# Patient Record
Sex: Male | Born: 1995 | Race: White | Hispanic: No | Marital: Single | Smoking: Former smoker
Health system: Southern US, Community
[De-identification: ages and names within clinical notes are randomized; demographics above are authoritative.]

## PROBLEM LIST (undated history)

## (undated) DIAGNOSIS — F419 Anxiety disorder, unspecified: Secondary | ICD-10-CM

## (undated) DIAGNOSIS — F909 Attention-deficit hyperactivity disorder, unspecified type: Secondary | ICD-10-CM

## (undated) DIAGNOSIS — F32A Depression, unspecified: Secondary | ICD-10-CM

## (undated) DIAGNOSIS — J45909 Unspecified asthma, uncomplicated: Secondary | ICD-10-CM

## (undated) DIAGNOSIS — R109 Unspecified abdominal pain: Secondary | ICD-10-CM

## (undated) DIAGNOSIS — T7840XA Allergy, unspecified, initial encounter: Secondary | ICD-10-CM

## (undated) DIAGNOSIS — F329 Major depressive disorder, single episode, unspecified: Secondary | ICD-10-CM

## (undated) DIAGNOSIS — N62 Hypertrophy of breast: Secondary | ICD-10-CM

## (undated) HISTORY — DX: Hypertrophy of breast: N62

## (undated) HISTORY — DX: Unspecified asthma, uncomplicated: J45.909

## (undated) HISTORY — DX: Depression, unspecified: F32.A

## (undated) HISTORY — DX: Allergy, unspecified, initial encounter: T78.40XA

## (undated) HISTORY — DX: Attention-deficit hyperactivity disorder, unspecified type: F90.9

## (undated) HISTORY — DX: Unspecified abdominal pain: R10.9

## (undated) HISTORY — DX: Anxiety disorder, unspecified: F41.9

## (undated) HISTORY — DX: Major depressive disorder, single episode, unspecified: F32.9

---
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
## 2016-07-02 ENCOUNTER — Encounter: Payer: Self-pay | Admitting: Gastroenterology

## 2016-08-09 ENCOUNTER — Ambulatory Visit: Payer: Self-pay | Admitting: Internal Medicine

## 2016-08-10 ENCOUNTER — Ambulatory Visit: Payer: Self-pay | Admitting: Gastroenterology

## 2016-10-21 ENCOUNTER — Ambulatory Visit: Payer: Self-pay | Admitting: Gastroenterology

## 2019-04-05 ENCOUNTER — Encounter: Payer: Self-pay | Admitting: Gastroenterology

## 2019-05-10 ENCOUNTER — Ambulatory Visit: Payer: Self-pay | Admitting: Gastroenterology

## 2019-05-14 ENCOUNTER — Other Ambulatory Visit: Payer: Self-pay

## 2019-05-14 ENCOUNTER — Ambulatory Visit (INDEPENDENT_AMBULATORY_CARE_PROVIDER_SITE_OTHER): Payer: PRIVATE HEALTH INSURANCE | Admitting: Gastroenterology

## 2019-05-14 ENCOUNTER — Encounter: Payer: Self-pay | Admitting: Gastroenterology

## 2019-05-14 ENCOUNTER — Other Ambulatory Visit (INDEPENDENT_AMBULATORY_CARE_PROVIDER_SITE_OTHER): Payer: PRIVATE HEALTH INSURANCE

## 2019-05-14 VITALS — BP 102/68 | HR 67 | Temp 97.6°F | Ht 71.0 in | Wt 210.8 lb

## 2019-05-14 DIAGNOSIS — R194 Change in bowel habit: Secondary | ICD-10-CM | POA: Diagnosis not present

## 2019-05-14 LAB — SEDIMENTATION RATE: Sed Rate: 7 mm/hr (ref 0–15)

## 2019-05-14 NOTE — Progress Notes (Signed)
HPI: This is a very pleasant 23 year old man who was referred by Dr. Bevelyn Buckles  Chief complaint is change in bowel habits  About a year ago he began to be bothered by a change in bowel habits.  Prior to this he would move his bowels once or twice a day.  About a year ago however this increased to 2-3 times a day usually he is woken up with the urge to move his bowels about every morning.  It can be loose it can be a bit constipated at times.  He has noticed a very minor blood in his stool at times.  He does tend to have some cramping in the early morning before he finally moves his bowels.  That cramping pain is almost always relieved when he moves his bowels.  He has not been on antibiotics anytime recently.  He drinks an energy drink with caffeine every morning and then usually a caffeinated soda in the p.m. as well.  He does not drink much alcohol.  He lost almost 100 pounds 2 years ago.  This was when he started working he was skipping a lot of meals.  Since then he has probably put back on 25 pounds.  His maternal grandmother and his paternal uncle both had Crohn's disease.  No colon cancer in his family  Old Data Reviewed: Lab tests August [id number] complete metabolic profile was normal, CBC was normal   Review of systems: Pertinent positive and negative review of systems were noted in the above HPI section. All other review negative.   Past Medical History:  Diagnosis Date  . Abdominal pain   . ADHD (attention deficit hyperactivity disorder)   . Allergy   . Depression   . Gynecomastia    Not diagnosed- sx consulted    Past Surgical History:  Procedure Laterality Date  . TONSILLECTOMY AND ADENOIDECTOMY  1999    Current Outpatient Medications  Medication Sig Dispense Refill  . diphenhydrAMINE (BENADRYL) 25 mg capsule 1-2 tabs three times a day as needed for itching    . doxycycline (DORYX) 100 MG EC tablet Take 100 mg by mouth 2 (two) times daily.    Marland Kitchen ibuprofen  (ADVIL,MOTRIN) 200 MG tablet Take 200 mg by mouth 3 (three) times daily as needed.     No current facility-administered medications for this visit.    Allergies as of 05/14/2019  . (No Known Allergies)    Family History  Problem Relation Age of Onset  . Crohn's disease Maternal Grandmother   . Crohn's disease Paternal Uncle     Social History   Socioeconomic History  . Marital status: Single    Spouse name: Not on file  . Number of children: Not on file  . Years of education: Not on file  . Highest education level: Not on file  Occupational History  . Not on file  Tobacco Use  . Smoking status: Former Smoker    Packs/day: 0.25    Years: 2.00    Pack years: 0.50    Types: Cigarettes    Start date: 2014    Quit date: 2016    Years since quitting: 4.9  . Smokeless tobacco: Never Used  Substance and Sexual Activity  . Alcohol use: Yes    Comment: social  . Drug use: Yes    Types: Marijuana    Comment: occ  . Sexual activity: Never  Other Topics Concern  . Not on file  Social History Narrative  . Not  on file   Social Determinants of Health   Financial Resource Strain:   . Difficulty of Paying Living Expenses: Not on file  Food Insecurity:   . Worried About Charity fundraiser in the Last Year: Not on file  . Ran Out of Food in the Last Year: Not on file  Transportation Needs:   . Lack of Transportation (Medical): Not on file  . Lack of Transportation (Non-Medical): Not on file  Physical Activity:   . Days of Exercise per Week: Not on file  . Minutes of Exercise per Session: Not on file  Stress:   . Feeling of Stress : Not on file  Social Connections:   . Frequency of Communication with Friends and Family: Not on file  . Frequency of Social Gatherings with Friends and Family: Not on file  . Attends Religious Services: Not on file  . Active Member of Clubs or Organizations: Not on file  . Attends Archivist Meetings: Not on file  . Marital  Status: Not on file  Intimate Partner Violence:   . Fear of Current or Ex-Partner: Not on file  . Emotionally Abused: Not on file  . Physically Abused: Not on file  . Sexually Abused: Not on file     Physical Exam: BP 102/68 (BP Location: Left Arm, Patient Position: Sitting, Cuff Size: Normal)   Pulse 67   Temp 97.6 F (36.4 C)   Ht 5\' 11"  (1.803 m)   Wt 210 lb 12.8 oz (95.6 kg)   BMI 29.40 kg/m  Constitutional: generally well-appearing Psychiatric: alert and oriented x3 Eyes: extraocular movements intact Mouth: oral pharynx moist, no lesions Neck: supple no lymphadenopathy Cardiovascular: heart regular rate and rhythm Lungs: clear to auscultation bilaterally Abdomen: soft, nontender, nondistended, no obvious ascites, no peritoneal signs, normal bowel sounds Extremities: no lower extremity edema bilaterally Skin: no lesions on visible extremities   Assessment and plan: 23 y.o. male with change in bowels  For about a year he has had 2-3 stools with some urgency and cramping in the morning usually.  Minor rectal bleeding.  Not sure if this is inflammatory bowel disease but it does run in his family with 2 second-degree relatives.  I recommended stool tests with GI pathogen panel and blood tests with sedimentation rate and celiac sprue serologies.  I recommended a trial of Imodium at bedtime every night.  Pending the results of the above he might need further testing, possibly a colonoscopy.   Please see the "Patient Instructions" section for addition details about the plan.   Owens Loffler, MD [facility] 05/14/2019, 8:35 AM  Cc: Bevelyn Buckles, MD

## 2019-05-14 NOTE — Patient Instructions (Signed)
If you are age 23 or older, your body mass index should be between 23-30. Your Body mass index is 29.4 kg/m. If this is out of the aforementioned range listed, please consider follow up with your Primary Care Provider.  If you are age 56 or younger, your body mass index should be between 19-25. Your Body mass index is 29.4 kg/m. If this is out of the aformentioned range listed, please consider follow up with your Primary Care Provider.   Your provider has requested that you go to the basement level for lab work before leaving today. Press "B" on the elevator. The lab is located at the first door on the left as you exit the elevator.   Please take 1 Imodium at bedtime.  Thank you for choosing me and [facility]

## 2019-05-15 LAB — TISSUE TRANSGLUTAMINASE, IGA: (tTG) Ab, IgA: 1 U/mL

## 2019-05-16 ENCOUNTER — Other Ambulatory Visit: Payer: Self-pay

## 2019-05-16 DIAGNOSIS — R194 Change in bowel habit: Secondary | ICD-10-CM

## 2019-05-17 ENCOUNTER — Other Ambulatory Visit (INDEPENDENT_AMBULATORY_CARE_PROVIDER_SITE_OTHER): Payer: PRIVATE HEALTH INSURANCE

## 2019-05-17 DIAGNOSIS — R194 Change in bowel habit: Secondary | ICD-10-CM

## 2019-05-17 LAB — IGA: IgA: 188 mg/dL (ref 68–378)

## 2019-05-21 LAB — GASTROINTESTINAL PATHOGEN PANEL PCR
C. difficile Tox A/B, PCR: NOT DETECTED
Campylobacter, PCR: NOT DETECTED
Cryptosporidium, PCR: NOT DETECTED
E coli (ETEC) LT/ST PCR: NOT DETECTED
E coli (STEC) stx1/stx2, PCR: NOT DETECTED
E coli 0157, PCR: NOT DETECTED
Giardia lamblia, PCR: NOT DETECTED
Norovirus, PCR: NOT DETECTED
Rotavirus A, PCR: NOT DETECTED
Salmonella, PCR: NOT DETECTED
Shigella, PCR: NOT DETECTED

## 2019-06-18 ENCOUNTER — Encounter: Payer: Self-pay | Admitting: Gastroenterology

## 2019-06-26 ENCOUNTER — Other Ambulatory Visit: Payer: Self-pay

## 2019-06-26 ENCOUNTER — Ambulatory Visit (AMBULATORY_SURGERY_CENTER): Payer: Self-pay | Admitting: *Deleted

## 2019-06-26 VITALS — Temp 97.8°F | Ht 71.0 in | Wt 208.0 lb

## 2019-06-26 DIAGNOSIS — Z01818 Encounter for other preprocedural examination: Secondary | ICD-10-CM

## 2019-06-26 DIAGNOSIS — R194 Change in bowel habit: Secondary | ICD-10-CM

## 2019-06-26 MED ORDER — SUPREP BOWEL PREP KIT 17.5-3.13-1.6 GM/177ML PO SOLN: ORAL | 0 refills | Status: DC

## 2019-06-26 NOTE — Progress Notes (Signed)
Pt is aware that care partner will wait in the car during procedure; if they feel like they will be too hot or cold to wait in the car; they may wait in the 4 th floor lobby. Patient is aware to bring only one care partner. We want them to wear a mask (we do not have any that we can provide them), practice social distancing, and we will check their temperatures when they get here.  I did remind the patient that their care partner needs to stay in the parking lot the entire time and have a cell phone available, we will call them when the pt is ready for discharge. Patient will wear mask into building.   No egg or soy allergy  No home oxygen use  No medications for weight loss taken  emmi information given  covid test 07-05-19 at 915

## 2019-07-05 ENCOUNTER — Other Ambulatory Visit: Payer: Self-pay | Admitting: Gastroenterology

## 2019-07-05 ENCOUNTER — Ambulatory Visit (INDEPENDENT_AMBULATORY_CARE_PROVIDER_SITE_OTHER): Payer: PRIVATE HEALTH INSURANCE

## 2019-07-05 DIAGNOSIS — Z1159 Encounter for screening for other viral diseases: Secondary | ICD-10-CM

## 2019-07-06 LAB — SARS CORONAVIRUS 2 (TAT 6-24 HRS): SARS Coronavirus 2: NEGATIVE

## 2019-07-09 ENCOUNTER — Other Ambulatory Visit: Payer: Self-pay

## 2019-07-09 ENCOUNTER — Encounter: Payer: Self-pay | Admitting: Gastroenterology

## 2019-07-09 ENCOUNTER — Ambulatory Visit (AMBULATORY_SURGERY_CENTER): Payer: PRIVATE HEALTH INSURANCE | Admitting: Gastroenterology

## 2019-07-09 VITALS — BP 97/70 | HR 50 | Temp 97.7°F | Resp 13 | Ht 71.0 in | Wt 208.0 lb

## 2019-07-09 DIAGNOSIS — R194 Change in bowel habit: Secondary | ICD-10-CM

## 2019-07-09 DIAGNOSIS — K5289 Other specified noninfective gastroenteritis and colitis: Secondary | ICD-10-CM | POA: Diagnosis not present

## 2019-07-09 DIAGNOSIS — D122 Benign neoplasm of ascending colon: Secondary | ICD-10-CM

## 2019-07-09 DIAGNOSIS — K5140 Inflammatory polyps of colon without complications: Secondary | ICD-10-CM

## 2019-07-09 DIAGNOSIS — K529 Noninfective gastroenteritis and colitis, unspecified: Secondary | ICD-10-CM

## 2019-07-09 MED ORDER — SODIUM CHLORIDE 0.9 % IV SOLN: 500.0000 mL | Freq: Once | INTRAVENOUS | Status: DC

## 2019-07-09 NOTE — Progress Notes (Signed)
Called to room to assist during endoscopic procedure.  Patient ID and intended procedure confirmed with present staff. Received instructions for my participation in the procedure from the performing physician.  

## 2019-07-09 NOTE — Op Note (Signed)
[place] Patient Name: Tydarius Minas Procedure Date: 07/09/2019 12:52 PM MRN: [id number] Endoscopist: Milus Banister , MD Age: 24 Referring MD:  Date of Birth: [birth year] Gender: Male Account #: [id number] Procedure:                Colonoscopy Indications:              CHange in bowels, urgency, FH of Crohn's disease Medicines:                Monitored Anesthesia Care Procedure:                Pre-Anesthesia Assessment:                           - Prior to the procedure, a History and Physical                            was performed, and patient medications and                            allergies were reviewed. The patient's tolerance of                            previous anesthesia was also reviewed. The risks                            and benefits of the procedure and the sedation                            options and risks were discussed with the patient.                            All questions were answered, and informed consent                            was obtained. Prior Anticoagulants: The patient has                            taken no previous anticoagulant or antiplatelet                            agents. ASA Grade Assessment: II - A patient with                            mild systemic disease. After reviewing the risks                            and benefits, the patient was deemed in                            satisfactory condition to undergo the procedure.                           After obtaining informed consent, the colonoscope  was passed under direct vision. Throughout the                            procedure, the patient's blood pressure, pulse, and                            oxygen saturations were monitored continuously. The                            Colonoscope was introduced through the anus and                            advanced to the the terminal ileum. The colonoscopy                            was  performed without difficulty. The patient                            tolerated the procedure well. The quality of the                            bowel preparation was good. The terminal ileum,                            ileocecal valve, appendiceal orifice, and rectum                            were photographed. Scope In: 1:18:18 PM Scope Out: 1:30:45 PM Scope Withdrawal Time: 0 hours 10 minutes 6 seconds  Total Procedure Duration: 0 hours 12 minutes 27 seconds  Findings:                 Small soft fleshy nodules throughout terminal                            ileum. Likely lymphoid hyperplasia but biopsies                            taken given FH of Crohn's disease.                           A 5 mm polyp was found in the ascending colon. The                            polyp was sessile. The polyp was removed with a                            cold snare. Resection and retrieval were complete.                           Normal colon mucosa otherwise, biopsied randomly.                           The exam was otherwise without abnormality on  direct and retroflexion views. Complications:            No immediate complications. Estimated blood loss:                            None. Estimated Blood Loss:     Estimated blood loss: none. Impression:               - Small soft fleshy nodules throughout terminal                            ileum. Likely lymphoid hyperplasia but biopsies                            taken given FH of Crohn's disease.                           - One 5 mm polyp in the ascending colon, removed                            with a cold snare. Resected and retrieved.                           - Normal colon mucosa otherwise, biopsied randomly                            to check for microscopic colitis.                           - The examination was otherwise normal on direct                            and retroflexion views. Recommendation:            - Patient has a contact number available for                            emergencies. The signs and symptoms of potential                            delayed complications were discussed with the                            patient. Return to normal activities tomorrow.                            Written discharge instructions were provided to the                            patient.                           - Resume previous diet.                           - Continue present medications.                           -  Await pathology results. Milus Banister, MD 07/09/2019 1:35:53 PM This report has been signed electronically.

## 2019-07-09 NOTE — Progress Notes (Signed)
Report to PACU, RN, vss, BBS= Clear.  

## 2019-07-09 NOTE — Progress Notes (Signed)
Pt's states no medical or surgical changes since previsit or office visit.  Temp JB VS DT  

## 2019-07-09 NOTE — Patient Instructions (Signed)
1 polyp removed today.  Dr Ardis Hughs will mail you a letter about these results.   YOU HAD AN ENDOSCOPIC PROCEDURE TODAY AT [facility]:   Refer to the procedure report that was given to you for any specific questions about what was found during the examination.  If the procedure report does not answer your questions, please call your gastroenterologist to clarify.  If you requested that your care partner not be given the details of your procedure findings, then the procedure report has been included in a sealed envelope for you to review at your convenience later.  YOU SHOULD EXPECT: Some feelings of bloating in the abdomen. Passage of more gas than usual.  Walking can help get rid of the air that was put into your GI tract during the procedure and reduce the bloating. If you had a lower endoscopy (such as a colonoscopy or flexible sigmoidoscopy) you may notice spotting of blood in your stool or on the toilet paper. If you underwent a bowel prep for your procedure, you may not have a normal bowel movement for a few days.  Please Note:  You might notice some irritation and congestion in your nose or some drainage.  This is from the oxygen used during your procedure.  There is no need for concern and it should clear up in a day or so.  SYMPTOMS TO REPORT IMMEDIATELY:   Following lower endoscopy (colonoscopy or flexible sigmoidoscopy):  Excessive amounts of blood in the stool  Significant tenderness or worsening of abdominal pains  Swelling of the abdomen that is new, acute  Fever of 100F or higher   For urgent or emergent issues, a gastroenterologist can be reached at any hour by calling [phone].   DIET:  We do recommend a small meal at first, but then you may proceed to your regular diet.  Drink plenty of fluids but you should avoid alcoholic beverages for 24 hours.  ACTIVITY:  You should plan to take it easy for the rest of today and you should NOT DRIVE or use heavy  machinery until tomorrow (because of the sedation medicines used during the test).    FOLLOW UP: Our staff will call the number listed on your records 48-72 hours following your procedure to check on you and address any questions or concerns that you may have regarding the information given to you following your procedure. If we do not reach you, we will leave a message.  We will attempt to reach you two times.  During this call, we will ask if you have developed any symptoms of COVID 19. If you develop any symptoms (ie: fever, flu-like symptoms, shortness of breath, cough etc.) before then, please call [phone].  If you test positive for Covid 19 in the 2 weeks post procedure, please call and report this information to [place].    If any biopsies were taken you will be contacted by phone or by letter within the next 1-3 weeks.  Please call us at [phone] if you have not heard about the biopsies in 3 weeks.    SIGNATURES/CONFIDENTIALITY: You and/or your care partner have signed paperwork which will be entered into your electronic medical record.  These signatures attest to the fact that that the information above on your After Visit Summary has been reviewed and is understood.  Full responsibility of the confidentiality of this discharge information lies with you and/or your care-partner.

## 2019-07-11 ENCOUNTER — Telehealth: Payer: Self-pay

## 2019-07-11 NOTE — Telephone Encounter (Signed)
  Follow up Call-  Call back number 07/09/2019  Post procedure Call Back phone  # [phone]  Permission to leave phone message Yes  Some recent data might be hidden     Patient questions:  Do you have a fever, pain , or abdominal swelling? No. Pain Score  0 *  Have you tolerated food without any problems? Yes.    Have you been able to return to your normal activities? Yes.    Do you have any questions about your discharge instructions: Diet   No. Medications  No. Follow up visit  No.  Do you have questions or concerns about your Care? No.  Actions: * If pain score is 4 or above: No action needed, pain <4. 1. Have you developed a fever since your procedure? no  2.   Have you had an respiratory symptoms (SOB or cough) since your procedure? no  3.   Have you tested positive for COVID 19 since your procedure no  4.   Have you had any family members/close contacts diagnosed with the COVID 19 since your procedure?  no   If yes to any of these questions please route to Joylene John, RN and Alphonsa Gin, Therapist, sports.

## 2019-08-17 ENCOUNTER — Ambulatory Visit: Payer: PRIVATE HEALTH INSURANCE | Admitting: Gastroenterology

## 2019-09-26 ENCOUNTER — Encounter: Payer: Self-pay | Admitting: Gastroenterology

## 2019-09-26 ENCOUNTER — Other Ambulatory Visit: Payer: Self-pay

## 2019-09-26 ENCOUNTER — Ambulatory Visit: Payer: PRIVATE HEALTH INSURANCE | Admitting: Gastroenterology

## 2019-09-26 VITALS — BP 122/58 | HR 64 | Temp 97.4°F | Ht 71.0 in | Wt 234.0 lb

## 2019-09-26 DIAGNOSIS — R194 Change in bowel habit: Secondary | ICD-10-CM

## 2019-09-26 MED ORDER — LOPERAMIDE HCL 2 MG PO TABS: 2.0000 mg | ORAL_TABLET | Freq: Every day | ORAL | 0 refills | Status: AC

## 2019-09-26 NOTE — Progress Notes (Signed)
Review of pertinent gastrointestinal problems: 1.  Change in bowels (urgency, cramping at times)  and family history of Crohn's disease led to colonoscopy February 2021.  Colonoscopy showed lymphoid hyperplasia and terminal ileum.  5 mm polyp in the ascending colon.  Normal colon mucosa otherwise which was randomly biopsied.  The nodules in the terminal ileum as well as the polyp in the colon were lymphoid aggregates.  The random biopsies from the colon showed "focal active colitis".  Labs December 2020 normal sed rate, normal GI pathogen panel, normal celiac sprue testing.   HPI: This is a very pleasant 24 year old man whom I last saw the time of a colonoscopy about 2 months ago.  He is here with his mother today.  His weight is up 24 pounds in about 5 months, same scale here in our office.  He continues to have 2-4 semiloose rarely formed stools daily..  Mild urgency.  Very mild intermittent bright blood per rectum.  He does not have significant abdominal pains.  ROS: complete GI ROS as described in HPI, all other review negative.  Constitutional:  No unintentional weight loss   Past Medical History:  Diagnosis Date  . Abdominal pain   . ADHD (attention deficit hyperactivity disorder)   . Allergy   . Anxiety   . Asthma    as a child  . Depression   . Gynecomastia    Not diagnosed- sx consulted    Past Surgical History:  Procedure Laterality Date  . TONSILLECTOMY AND ADENOIDECTOMY  1999    Current Outpatient Medications  Medication Sig Dispense Refill  . cetirizine (ZYRTEC) 10 MG tablet Take 10 mg by mouth as needed for allergies.     No current facility-administered medications for this visit.    Allergies as of 09/26/2019  . (No Known Allergies)    Family History  Problem Relation Age of Onset  . Crohn's disease Maternal Grandmother   . Crohn's disease Paternal Uncle   . Colon cancer Neg Hx   . Esophageal cancer Neg Hx   . Rectal cancer Neg Hx   . Stomach cancer  Neg Hx     Social History   Socioeconomic History  . Marital status: Single    Spouse name: Not on file  . Number of children: 0  . Years of education: Not on file  . Highest education level: Not on file  Occupational History  . Occupation: delivery person  Tobacco Use  . Smoking status: Former Smoker    Packs/day: 0.25    Years: 2.00    Pack years: 0.50    Types: Cigarettes    Start date: 2014    Quit date: 2016    Years since quitting: 5.3  . Smokeless tobacco: Never Used  Substance and Sexual Activity  . Alcohol use: Yes    Comment: social  . Drug use: Yes    Types: Marijuana    Comment: occ  . Sexual activity: Never  Other Topics Concern  . Not on file  Social History Narrative  . Not on file   Social Determinants of Health   Financial Resource Strain:   . Difficulty of Paying Living Expenses:   Food Insecurity:   . Worried About Charity fundraiser in the Last Year:   . Arboriculturist in the Last Year:   Transportation Needs:   . Film/video editor (Medical):   Marland Kitchen Lack of Transportation (Non-Medical):   Physical Activity:   . Days of  Exercise per Week:   . Minutes of Exercise per Session:   Stress:   . Feeling of Stress :   Social Connections:   . Frequency of Communication with Friends and Family:   . Frequency of Social Gatherings with Friends and Family:   . Attends Religious Services:   . Active Member of Clubs or Organizations:   . Attends Archivist Meetings:   Marland Kitchen Marital Status:   Intimate Partner Violence:   . Fear of Current or Ex-Partner:   . Emotionally Abused:   Marland Kitchen Physically Abused:   . Sexually Abused:      Physical Exam: BP (!) 122/58   Pulse 64   Temp (!) 97.4 F (36.3 C)   Ht 5\' 11"  (1.803 m)   Wt 234 lb (106.1 kg)   BMI 32.64 kg/m  Constitutional: generally well-appearing Psychiatric: alert and oriented x3 Abdomen: soft, nontender, nondistended, no obvious ascites, no peritoneal signs, normal bowel  sounds No peripheral edema noted in lower extremities  Assessment and plan: 24 y.o. male with change in bowels  First I explained to him that there is no convincing evidence that he has inflammatory bowel disease either by pathology or history or endoscopic appearance of his colon and terminal ileum.  His bowel changes are mild.  I recommended he start taking a single over-the-counter Imodium 1 pill once daily on a scheduled basis and return to see me in 2 months and sooner if any issues.  Please see the "Patient Instructions" section for addition details about the plan.  Owens Loffler, MD [facility] 09/26/2019, 9:05 AM   Total time on date of encounter was 20 minutes (this included time spent preparing to see the patient reviewing records; obtaining and/or reviewing separately obtained history; performing a medically appropriate exam and/or evaluation; counseling and educating the patient and family if present; ordering medications, tests or procedures if applicable; and documenting clinical information in the health record).

## 2019-09-26 NOTE — Patient Instructions (Addendum)
If you are age 24 or older, your body mass index should be between 23-30. Your Body mass index is 32.64 kg/m. If this is out of the aforementioned range listed, please consider follow up with your Primary Care Provider.  If you are age 20 or younger, your body mass index should be between 19-25. Your Body mass index is 32.64 kg/m. If this is out of the aformentioned range listed, please consider follow up with your Primary Care Provider.   Please purchase the following medications over the counter and take as directed:  START: Imodium over the counter.  Take one tablet daily.  You will follow up on 11-27-19 at 3:20pm.  Please arrive 10 minutes prior to appointment.  Please call our office sooner if you have any issues.  Thank you for entrusting me with your care and choosing [facility].  Dr Ardis Hughs

## 2019-11-27 ENCOUNTER — Ambulatory Visit: Payer: PRIVATE HEALTH INSURANCE | Admitting: Gastroenterology
